# Patient Record
Sex: Female | Born: 1995 | Race: White | Hispanic: No | Marital: Single | Smoking: Never smoker
Health system: Southern US, Community
[De-identification: ages and names within clinical notes are randomized; demographics above are authoritative.]

## PROBLEM LIST (undated history)

## (undated) DIAGNOSIS — Z9889 Other specified postprocedural states: Secondary | ICD-10-CM

## (undated) DIAGNOSIS — R112 Nausea with vomiting, unspecified: Secondary | ICD-10-CM

## (undated) DIAGNOSIS — B2790 Infectious mononucleosis, unspecified without complication: Secondary | ICD-10-CM

## (undated) HISTORY — PX: OTHER SURGICAL HISTORY: SHX169

---
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
## 2018-02-07 ENCOUNTER — Other Ambulatory Visit: Payer: Self-pay

## 2018-02-07 ENCOUNTER — Encounter (HOSPITAL_COMMUNITY): Payer: Self-pay

## 2018-02-07 ENCOUNTER — Encounter (HOSPITAL_COMMUNITY): Payer: Self-pay | Admitting: Family Medicine

## 2018-02-07 ENCOUNTER — Ambulatory Visit (HOSPITAL_COMMUNITY)
Admission: EM | Admit: 2018-02-07 | Discharge: 2018-02-07 | Disposition: A | Discharge status: Home / Self Care | Payer: BLUE CROSS/BLUE SHIELD | Source: Home / Self Care | Attending: Internal Medicine | Admitting: Internal Medicine

## 2018-02-07 ENCOUNTER — Observation Stay (HOSPITAL_COMMUNITY)
Admission: EM | Admit: 2018-02-07 | Discharge: 2018-02-08 | Disposition: A | Discharge status: Home / Self Care | Payer: BLUE CROSS/BLUE SHIELD | Attending: Internal Medicine | Admitting: Internal Medicine

## 2018-02-07 ENCOUNTER — Emergency Department (HOSPITAL_COMMUNITY): Payer: BLUE CROSS/BLUE SHIELD

## 2018-02-07 DIAGNOSIS — K1121 Acute sialoadenitis: Secondary | ICD-10-CM | POA: Diagnosis not present

## 2018-02-07 DIAGNOSIS — Z8619 Personal history of other infectious and parasitic diseases: Secondary | ICD-10-CM | POA: Insufficient documentation

## 2018-02-07 DIAGNOSIS — R220 Localized swelling, mass and lump, head: Secondary | ICD-10-CM

## 2018-02-07 DIAGNOSIS — K1120 Sialoadenitis, unspecified: Secondary | ICD-10-CM | POA: Diagnosis not present

## 2018-02-07 DIAGNOSIS — R6884 Jaw pain: Secondary | ICD-10-CM | POA: Insufficient documentation

## 2018-02-07 DIAGNOSIS — R590 Localized enlarged lymph nodes: Secondary | ICD-10-CM | POA: Diagnosis not present

## 2018-02-07 HISTORY — DX: Other specified postprocedural states: R11.2

## 2018-02-07 HISTORY — DX: Infectious mononucleosis, unspecified without complication: B27.90

## 2018-02-07 HISTORY — DX: Other specified postprocedural states: Z98.890

## 2018-02-07 LAB — CBC WITH DIFFERENTIAL/PLATELET
Basophils Absolute: 0 10*3/uL (ref 0.0–0.1)
Basophils Relative: 0 %
Eosinophils Absolute: 0 10*3/uL (ref 0.0–0.7)
Eosinophils Relative: 0 %
HEMATOCRIT: 39.9 % (ref 36.0–46.0)
HEMOGLOBIN: 13.3 g/dL (ref 12.0–15.0)
LYMPHS ABS: 1.5 10*3/uL (ref 0.7–4.0)
LYMPHS PCT: 14 %
MCH: 30.9 pg (ref 26.0–34.0)
MCHC: 33.3 g/dL (ref 30.0–36.0)
MCV: 92.8 fL (ref 78.0–100.0)
Monocytes Absolute: 0.9 10*3/uL (ref 0.1–1.0)
Monocytes Relative: 9 %
NEUTROS ABS: 7.9 10*3/uL — AB (ref 1.7–7.7)
NEUTROS PCT: 77 %
Platelets: 229 10*3/uL (ref 150–400)
RBC: 4.3 MIL/uL (ref 3.87–5.11)
RDW: 13 % (ref 11.5–15.5)
WBC: 10.3 10*3/uL (ref 4.0–10.5)

## 2018-02-07 LAB — BASIC METABOLIC PANEL
Anion gap: 10 (ref 5–15)
BUN: 9 mg/dL (ref 6–20)
CHLORIDE: 103 mmol/L (ref 98–111)
CO2: 24 mmol/L (ref 22–32)
CREATININE: 0.67 mg/dL (ref 0.44–1.00)
Calcium: 9.1 mg/dL (ref 8.9–10.3)
GFR calc Af Amer: >60 mL/min (ref >60)
GFR calc non Af Amer: >60 mL/min (ref >60)
Glucose, Bld: 85 mg/dL (ref 70–99)
POTASSIUM: 3.7 mmol/L (ref 3.5–5.1)
SODIUM: 137 mmol/L (ref 135–145)

## 2018-02-07 LAB — I-STAT BETA HCG BLOOD, ED (MC, WL, AP ONLY)

## 2018-02-07 MED ORDER — DEXAMETHASONE SODIUM PHOSPHATE 10 MG/ML IJ SOLN
10.0000 mg | Freq: Once | INTRAMUSCULAR | Status: AC
  Administered 2018-02-07: 10 mg via INTRAVENOUS
  Filled 2018-02-07: qty 1

## 2018-02-07 MED ORDER — ACETAMINOPHEN 325 MG PO TABS: 650.0000 mg | ORAL_TABLET | Freq: Four times a day (QID) | ORAL | Status: DC | PRN

## 2018-02-07 MED ORDER — SODIUM CHLORIDE 0.9% FLUSH
3.0000 mL | Freq: Two times a day (BID) | INTRAVENOUS | Status: DC
  Administered 2018-02-07: 3 mL via INTRAVENOUS

## 2018-02-07 MED ORDER — ONDANSETRON HCL 4 MG/2ML IJ SOLN: 4.0000 mg | Freq: Four times a day (QID) | INTRAMUSCULAR | Status: DC | PRN

## 2018-02-07 MED ORDER — SODIUM CHLORIDE 0.9 % IV SOLN
Freq: Once | INTRAVENOUS | Status: AC
  Administered 2018-02-08: 02:00:00 via INTRAVENOUS

## 2018-02-07 MED ORDER — OXYMETAZOLINE HCL 0.05 % NA SOLN
1.0000 | Freq: Once | NASAL | Status: DC | PRN
  Filled 2018-02-07: qty 15

## 2018-02-07 MED ORDER — ACETAMINOPHEN 650 MG RE SUPP: 650.0000 mg | Freq: Four times a day (QID) | RECTAL | Status: DC | PRN

## 2018-02-07 MED ORDER — CEFTRIAXONE SODIUM 1 G IJ SOLR
1.0000 g | Freq: Once | INTRAMUSCULAR | Status: AC
  Administered 2018-02-07: 1 g via INTRAMUSCULAR

## 2018-02-07 MED ORDER — ONDANSETRON HCL 4 MG/2ML IJ SOLN
4.0000 mg | Freq: Once | INTRAMUSCULAR | Status: AC
  Administered 2018-02-07: 4 mg via INTRAVENOUS
  Filled 2018-02-07: qty 2

## 2018-02-07 MED ORDER — KETOROLAC TROMETHAMINE 30 MG/ML IJ SOLN
15.0000 mg | Freq: Once | INTRAMUSCULAR | Status: AC
  Administered 2018-02-07: 15 mg via INTRAVENOUS
  Filled 2018-02-07: qty 1

## 2018-02-07 MED ORDER — LIDOCAINE HCL (PF) 1 % IJ SOLN
INTRAMUSCULAR | Status: AC
  Filled 2018-02-07: qty 2

## 2018-02-07 MED ORDER — SILVER NITRATE-POT NITRATE 75-25 % EX MISC
1.0000 | Freq: Once | CUTANEOUS | Status: DC | PRN
  Filled 2018-02-07: qty 1

## 2018-02-07 MED ORDER — SODIUM CHLORIDE 0.9 % IV BOLUS
1000.0000 mL | Freq: Once | INTRAVENOUS | Status: AC
  Administered 2018-02-07: 1000 mL via INTRAVENOUS

## 2018-02-07 MED ORDER — SODIUM CHLORIDE 0.9 % IV SOLN: Freq: Once | INTRAVENOUS | Status: DC

## 2018-02-07 MED ORDER — HYDROCODONE-ACETAMINOPHEN 5-325 MG PO TABS: 1.0000 | ORAL_TABLET | ORAL | Status: DC | PRN

## 2018-02-07 MED ORDER — LIDOCAINE HCL 2 % EX GEL
1.0000 "application " | Freq: Once | CUTANEOUS | Status: DC | PRN
  Filled 2018-02-07: qty 5

## 2018-02-07 MED ORDER — IOHEXOL 300 MG/ML  SOLN
75.0000 mL | Freq: Once | INTRAMUSCULAR | Status: AC | PRN
  Administered 2018-02-07: 75 mL via INTRAVENOUS

## 2018-02-07 MED ORDER — BACITRACIN-NEOMYCIN-POLYMYXIN 400-5-5000 EX OINT: 1.0000 "application " | TOPICAL_OINTMENT | Freq: Once | CUTANEOUS | Status: DC | PRN

## 2018-02-07 MED ORDER — NORETHINDRONE ACET-ETHINYL EST 1-20 MG-MCG PO TABS
1.0000 | ORAL_TABLET | Freq: Every day | ORAL | Status: DC
  Administered 2018-02-07: 1 via ORAL

## 2018-02-07 MED ORDER — ENOXAPARIN SODIUM 40 MG/0.4ML ~~LOC~~ SOLN
40.0000 mg | Freq: Every day | SUBCUTANEOUS | Status: DC
  Administered 2018-02-07: 40 mg via SUBCUTANEOUS
  Filled 2018-02-07: qty 0.4

## 2018-02-07 MED ORDER — SODIUM CHLORIDE 0.9 % IV SOLN
3.0000 g | Freq: Four times a day (QID) | INTRAVENOUS | Status: DC
  Administered 2018-02-07 – 2018-02-08 (×4): 3 g via INTRAVENOUS
  Filled 2018-02-07 (×5): qty 3

## 2018-02-07 MED ORDER — AMOXICILLIN-POT CLAVULANATE 875-125 MG PO TABS: 1.0000 | ORAL_TABLET | Freq: Two times a day (BID) | ORAL | 0 refills | Status: AC

## 2018-02-07 MED ORDER — CEFTRIAXONE SODIUM 1 G IJ SOLR
INTRAMUSCULAR | Status: AC
  Filled 2018-02-07: qty 10

## 2018-02-07 MED ORDER — FLUOXETINE HCL 20 MG PO CAPS
40.0000 mg | ORAL_CAPSULE | Freq: Every day | ORAL | Status: DC
  Administered 2018-02-08: 40 mg via ORAL
  Filled 2018-02-07: qty 2

## 2018-02-07 MED ORDER — ONDANSETRON HCL 4 MG PO TABS: 4.0000 mg | ORAL_TABLET | Freq: Four times a day (QID) | ORAL | Status: DC | PRN

## 2018-02-07 MED ORDER — KETOROLAC TROMETHAMINE 15 MG/ML IJ SOLN: 15.0000 mg | Freq: Four times a day (QID) | INTRAMUSCULAR | Status: DC | PRN

## 2018-02-07 MED ORDER — LIDOCAINE HCL 4 % EX SOLN
0.0000 mL | Freq: Once | CUTANEOUS | Status: DC | PRN
  Filled 2018-02-07: qty 50

## 2018-02-07 MED ORDER — LIDOCAINE-EPINEPHRINE (PF) 2 %-1:200000 IJ SOLN
0.0000 mL | Freq: Once | INTRAMUSCULAR | Status: DC | PRN
  Filled 2018-02-07: qty 30

## 2018-02-07 MED ORDER — IBUPROFEN 400 MG PO TABS
400.0000 mg | ORAL_TABLET | ORAL | Status: DC | PRN
Start: 2018-02-07 — End: 2018-02-08
  Administered 2018-02-07: 400 mg via ORAL
  Filled 2018-02-07: qty 1

## 2018-02-07 NOTE — H&P (Signed)
History and Physical    Dana Corneanna Byron RUE:[id number] DOB: 08/17/1995 DOA: 02/07/2018  Referring MD/NP/PA: Harolyn RutherfordShawn Joy, PA-C PCP: System, Provider Not In  Patient coming from: home  Chief Complaint: Facial swelling  I have personally briefly reviewed patient's old medical records in [place] [facility]   HPI: Dana [name] is a 22 y.o. female with medical history significant of history of mononucleosis; who presents with complaints of right jaw swelling and pain which she first noted yesterday morning.  Initially swelling was minimal.  She describes the pain as an achy with radiation down to the upper part of her neck.  Associated symptoms include some mild discomfort with eating. Denies having any fever, chills, abdominal pain, vomiting, or inability to tolerate secretions.  Patient was seen in urgent care yesterday and given Aleve.  However she woke up this morning with worsening swelling and pain of her jaw.  She went back to urgent care today, and was given IM Rocephin and sent to the emergency room for CT scan of her neck.     ED Course: Upon admission to the emergency department patient was noted to be afebrile with vitals within normal limits.  Labs revealed WBC 10.3 with increased neutrophil count, but otherwise were within normal limits.  CT scan of the neck and soft tissues with contrast showed enlarged right parotid gland with surrounding inflammation suggestive of acute parotiditis.  Dr. Lazarus SalinesWolicki of ENT was consulted and recommended treated patient with Unasyn, 10 mg of Decadron, 1 L of normal saline IV fluids, and 15 mg of ketorolac IV.  Review of Systems  Constitutional: Negative for chills and fever.  HENT: Negative for ear pain and sore throat.        Positive for right jaw swelling and pain  Eyes: Negative for photophobia and pain.  Respiratory: Negative for cough and shortness of breath.   Cardiovascular: Negative for chest pain and leg swelling.  Gastrointestinal: Negative for  abdominal pain, nausea and vomiting.  Genitourinary: Negative for dysuria and frequency.  Musculoskeletal: Negative for falls.  Skin: Negative for itching and rash.  Neurological: Negative for focal weakness and loss of consciousness.  Psychiatric/Behavioral: Negative for substance abuse. The patient is not nervous/anxious.     Past Medical History:  Diagnosis Date  . Mononucleosis Fall 2017 to Winter 2018  . PONV (postoperative nausea and vomiting)    vomiiting with wisdom teeth removal    Past Surgical History:  Procedure Laterality Date  . dental implant and bone graft - right upper near the front - Fall 2015    . WISDOM TOOTH EXTRACTION Bilateral 2015     reports that she has never smoked. She has never used smokeless tobacco. She reports that she drinks alcohol. She reports that she does not use drugs.  No Known Allergies  Family History  Problem Relation Age of Onset  . Healthy Mother   . Healthy Father     Prior to Admission medications   Medication Sig Start Date End Date Taking? Authorizing Provider  FLUoxetine (PROZAC) 40 MG capsule Take 40 mg by mouth daily.   Yes [provider]  ibuprofen (ADVIL,MOTRIN) 200 MG tablet Take 400 mg by mouth daily as needed (pain).   Yes [provider]  naproxen sodium (ALEVE) 220 MG tablet Take 440 mg by mouth daily as needed (pain).   Yes [provider]  norethindrone-ethinyl estradiol (MICROGESTIN,JUNEL,LOESTRIN) 1-20 MG-MCG tablet Take 1 tablet by mouth daily.   Yes [provider]  amoxicillin-clavulanate (  AUGMENTIN) 875-125 MG tablet Take 1 tablet by mouth every 12 (twelve) hours. 02/07/18   Janace Aris, NP    Physical Exam:  Constitutional: Young female NAD, calm, comfortable Vitals:   02/07/18 1639 02/07/18 1640  BP:  (!) 128/93  Pulse:  95  Resp:  14  Temp:  98.4 F (36.9 C)  TempSrc:  Oral  SpO2:  100%  Weight: 65.8 kg (145 lb)   Height: 5\' 3"  (1.6 m)    Eyes: PERRL, lids  and conjunctivae normal ENMT: Swelling noted to the right parotid and of the jaw with trismus present Neck: Right submandibular lymphadenopathy noted with tenderness to palpation of the neck and jaw area.  No stridor noted. Respiratory: clear to auscultation bilaterally, no wheezing, no crackles. Normal respiratory effort. No accessory muscle use.  Cardiovascular: Regular rate and rhythm, no murmurs / rubs / gallops. No extremity edema. 2+ pedal pulses. No carotid bruits.  Abdomen: no tenderness, no masses palpated. No hepatosplenomegaly. Bowel sounds positive.  Musculoskeletal: no clubbing / cyanosis. No joint deformity upper and lower extremities. Good ROM, no contractures. Normal muscle tone.  Skin: no rashes, lesions, ulcers. No induration Neurologic: CN 2-12 grossly intact. Sensation intact, DTR normal. Strength 5/5 in all 4.  Psychiatric: Normal judgment and insight. Alert and oriented x 3. Normal mood.     Labs on Admission: I have personally reviewed following labs and imaging studies  CBC: Recent Labs  Lab 02/07/18 1751  WBC 10.3  NEUTROABS 7.9*  HGB 13.3  HCT 39.9  MCV 92.8  PLT 229   Basic Metabolic Panel: Recent Labs  Lab 02/07/18 1751  NA 137  K 3.7  CL 103  CO2 24  GLUCOSE 85  BUN 9  CREATININE 0.67  CALCIUM 9.1   GFR: Estimated Creatinine Clearance: 101.5 mL/min (by C-G formula based on SCr of 0.67 mg/dL). Liver Function Tests: No results for input(s): AST, ALT, ALKPHOS, BILITOT, PROT, ALBUMIN in the last 168 hours. No results for input(s): LIPASE, AMYLASE in the last 168 hours. No results for input(s): AMMONIA in the last 168 hours. Coagulation Profile: No results for input(s): INR, PROTIME in the last 168 hours. Cardiac Enzymes: No results for input(s): CKTOTAL, CKMB, CKMBINDEX, TROPONINI in the last 168 hours. BNP (last 3 results) No results for input(s): PROBNP in the last 8760 hours. HbA1C: No results for input(s): HGBA1C in the last 72  hours. CBG: No results for input(s): GLUCAP in the last 168 hours. Lipid Profile: No results for input(s): CHOL, HDL, LDLCALC, TRIG, CHOLHDL, LDLDIRECT in the last 72 hours. Thyroid Function Tests: No results for input(s): TSH, T4TOTAL, FREET4, T3FREE, THYROIDAB in the last 72 hours. Anemia Panel: No results for input(s): VITAMINB12, FOLATE, FERRITIN, TIBC, IRON, RETICCTPCT in the last 72 hours. Urine analysis: No results found for: COLORURINE, APPEARANCEUR, LABSPEC, PHURINE, GLUCOSEU, HGBUR, BILIRUBINUR, KETONESUR, PROTEINUR, UROBILINOGEN, NITRITE, LEUKOCYTESUR Sepsis Labs: No results found for this or any previous visit (from the past 240 hour(s)).   Radiological Exams on Admission: Ct Soft Tissue Neck W Contrast  Result Date: 02/07/2018 CLINICAL DATA:  Right facial swelling. Swelling and pain have rapidly worsened. EXAM: CT NECK WITH CONTRAST TECHNIQUE: Multidetector CT imaging of the neck was performed using the standard protocol following the bolus administration of intravenous contrast. CONTRAST:  75mL OMNIPAQUE IOHEXOL 300 MG/ML  SOLN COMPARISON:  None. FINDINGS: PHARYNX AND LARYNX: --Nasopharynx: Fossae of Rosenmuller are clear. Normal adenoid tonsils for age. --Oral cavity and oropharynx: The palatine and lingual tonsils are  normal. The visible oral cavity and floor of mouth are normal. --Hypopharynx: Normal vallecula and pyriform sinuses. --Larynx: Normal epiglottis and pre-epiglottic space. Normal aryepiglottic and vocal folds. --Retropharyngeal space: No abscess, effusion or lymphadenopathy. SALIVARY GLANDS: --Parotid: The right parotid gland is enlarged with surrounding inflammatory stranding that extends along the lateral right neck to the level of the thyroid cartilage. There is thickening of the overlying platysma muscle with mild inflammatory induration of the adjacent subcutaneous fat. There is no abscess or fluid collection. No sialolithiasis. The left parotid gland is normal.  --Submandibular: Symmetric without inflammation. No sialolithiasis or ductal dilatation. --Sublingual: Normal. No ranula or other visible lesion of the base of tongue and floor of mouth. THYROID: Normal. LYMPH NODES: There are multiple right level 2A reactive lymph nodes measuring up to 9 mm. VASCULAR: Major cervical vessels are patent. LIMITED INTRACRANIAL: Normal. VISUALIZED ORBITS: Normal. MASTOIDS AND VISUALIZED PARANASAL SINUSES: No fluid levels or advanced mucosal thickening. No mastoid effusion. SKELETON: No bony spinal canal stenosis. No lytic or blastic lesions. UPPER CHEST: Clear. OTHER: None. IMPRESSION: 1. Enlarged right parotid gland with surrounding inflammatory change extending inferiorly along the right neck to approximately the level of the thyroid cartilage. This is most suggestive of acute parotiditis. No sialolithiasis or ductal obstruction. 2. Reactive right cervical lymphadenopathy. Electronically Signed   By: Deatra Robinson M.D.   On: 02/07/2018 19:26      Assessment/Plan Parotiditis: Acute.  Patient presents with swelling of the jaw since yesterday morning.  CT imaging reveals signs of acute parotiditis without signs of obstruction.  Patient treated with Unasyn.  ENT to see in a.m. - Admit to a MedSurg bed - Continue Unasyn IV - Toradol IV as needed for pain  - Appreciate ENT consultative services Dr. Ellie Lunch to see in a.m - Continue recommendations for patient to eat something sour every 1 hour  DVT prophylaxis: Lovenox Code Status: Full Family Communication: Discussed plan of care with the patient family present at bedside Disposition Plan: Likely discharge home in 1 to 2 days Consults called: ENT Admission status:observation   Clydie Braun MD [facility] Pager [phone]   If 7PM-7AM, please contact night-coverage [email or web address] Password [facility]  02/07/2018, 8:09 PM

## 2018-02-07 NOTE — Consult Note (Signed)
Dana [name],  Dana [name] 22 y.o., female [id number]     Chief Complaint:  RIGHT cheek swelling  HPI: 22 yo wf, sl upper neck pain 2 days ago.  Onset small mass at angle of RIGHT mandible yesterday.   Today with full swelling of RIGHT parotid area.  Some trismus.  No additional swelling or pain with meals.  No fever.  No trauma to her neck or face.  No radiocontrast dye.  No prior similar events.  No complaints of dry eyes or dry mouth.  Possible pericoronitis on RIGHT side for which she is overdue to see the dentist.  No immune compromise.  Has presumably had all appropriate vaccinations including mumps.    PMH: Past Medical History:  Diagnosis Date  . Mononucleosis Fall 2017 to Winter 2018  . PONV (postoperative nausea and vomiting)    vomiiting with wisdom teeth removal    Surg Hx: Past Surgical History:  Procedure Laterality Date  . dental implant and bone graft - right upper near the front - Fall 2015    . WISDOM TOOTH EXTRACTION Bilateral 2015    FHx:   Family History  Problem Relation Age of Onset  . Healthy Mother   . Healthy Father    SocHx:  reports that she has never smoked. She has never used smokeless tobacco. She reports that she drinks alcohol. She reports that she does not use drugs.  ALLERGIES: No Known Allergies  Medications Prior to Admission  Medication Sig Dispense Refill  . FLUoxetine (PROZAC) 40 MG capsule Take 40 mg by mouth daily.    Marland Kitchen ibuprofen (ADVIL,MOTRIN) 200 MG tablet Take 400 mg by mouth daily as needed (pain).    . naproxen sodium (ALEVE) 220 MG tablet Take 440 mg by mouth daily as needed (pain).    . norethindrone-ethinyl estradiol (MICROGESTIN,JUNEL,LOESTRIN) 1-20 MG-MCG tablet Take 1 tablet by mouth daily.    Marland Kitchen amoxicillin-clavulanate (AUGMENTIN) 875-125 MG tablet Take 1 tablet by mouth every 12 (twelve) hours. 14 tablet 0    Results for orders placed or performed during the hospital encounter of 02/07/18 (from the past 48 hour(s))  CBC with  Differential     Status: Abnormal   Collection Time: 02/07/18  5:51 PM  Result Value Ref Range   WBC 10.3 4.0 - 10.5 K/uL   RBC 4.30 3.87 - 5.11 MIL/uL   Hemoglobin 13.3 12.0 - 15.0 g/dL   HCT 39.9 36.0 - 46.0 %   MCV 92.8 78.0 - 100.0 fL   MCH 30.9 26.0 - 34.0 pg   MCHC 33.3 30.0 - 36.0 g/dL   RDW 13.0 11.5 - 15.5 %   Platelets 229 150 - 400 K/uL   Neutrophils Relative % 77 %   Neutro Abs 7.9 (H) 1.7 - 7.7 K/uL   Lymphocytes Relative 14 %   Lymphs Abs 1.5 0.7 - 4.0 K/uL   Monocytes Relative 9 %   Monocytes Absolute 0.9 0.1 - 1.0 K/uL   Eosinophils Relative 0 %   Eosinophils Absolute 0.0 0.0 - 0.7 K/uL   Basophils Relative 0 %   Basophils Absolute 0.0 0.0 - 0.1 K/uL    Comment: Performed at [facility], [place] [address], [place], [place] [address]  Basic metabolic panel     Status: None   Collection Time: 02/07/18  5:51 PM  Result Value Ref Range   Sodium 137 135 - 145 mmol/L   Potassium 3.7 3.5 - 5.1 mmol/L   Chloride 103 98 - 111 mmol/L  CO2 24 22 - 32 mmol/L   Glucose, Bld 85 70 - 99 mg/dL   BUN 9 6 - 20 mg/dL   Creatinine, Ser 0.67 0.44 - 1.00 mg/dL   Calcium 9.1 8.9 - 10.3 mg/dL   GFR calc non Af Amer >60 >60 mL/min   GFR calc Af Amer >60 >60 mL/min    Comment: (NOTE) The eGFR has been calculated using the CKD EPI equation. This calculation has not been validated in all clinical situations. eGFR's persistently <60 mL/min signify possible Chronic Kidney Disease.    Anion gap 10 5 - 15    Comment: Performed at [facility], [place] [address], [place], [place] [address]  I-Stat beta hCG blood, ED     Status: None   Collection Time: 02/07/18  6:03 PM  Result Value Ref Range   I-stat hCG, quantitative <5.0 <5 mIU/mL   Comment 3            Comment:   GEST. AGE      CONC.  (mIU/mL)   <=1 WEEK        5 - 50     2 WEEKS       50 - 500     3 WEEKS       100 - 10,000     4 WEEKS     1,000 - 30,000        FEMALE AND NON-PREGNANT  FEMALE:     LESS THAN 5 mIU/mL    Ct Soft Tissue Neck W Contrast  Result Date: 02/07/2018 CLINICAL DATA:  Right facial swelling. Swelling and pain have rapidly worsened. EXAM: CT NECK WITH CONTRAST TECHNIQUE: Multidetector CT imaging of the neck was performed using the standard protocol following the bolus administration of intravenous contrast. CONTRAST:  36m OMNIPAQUE IOHEXOL 300 MG/ML  SOLN COMPARISON:  None. FINDINGS: PHARYNX AND LARYNX: --Nasopharynx: Fossae of Rosenmuller are clear. Normal adenoid tonsils for age. --Oral cavity and oropharynx: The palatine and lingual tonsils are normal. The visible oral cavity and floor of mouth are normal. --Hypopharynx: Normal vallecula and pyriform sinuses. --Larynx: Normal epiglottis and pre-epiglottic space. Normal aryepiglottic and vocal folds. --Retropharyngeal space: No abscess, effusion or lymphadenopathy. SALIVARY GLANDS: --Parotid: The right parotid gland is enlarged with surrounding inflammatory stranding that extends along the lateral right neck to the level of the thyroid cartilage. There is thickening of the overlying platysma muscle with mild inflammatory induration of the adjacent subcutaneous fat. There is no abscess or fluid collection. No sialolithiasis. The left parotid gland is normal. --Submandibular: Symmetric without inflammation. No sialolithiasis or ductal dilatation. --Sublingual: Normal. No ranula or other visible lesion of the base of tongue and floor of mouth. THYROID: Normal. LYMPH NODES: There are multiple right level 2A reactive lymph nodes measuring up to 9 mm. VASCULAR: Major cervical vessels are patent. LIMITED INTRACRANIAL: Normal. VISUALIZED ORBITS: Normal. MASTOIDS AND VISUALIZED PARANASAL SINUSES: No fluid levels or advanced mucosal thickening. No mastoid effusion. SKELETON: No bony spinal canal stenosis. No lytic or blastic lesions. UPPER CHEST: Clear. OTHER: None. IMPRESSION: 1. Enlarged right parotid gland with surrounding  inflammatory change extending inferiorly along the right neck to approximately the level of the thyroid cartilage. This is most suggestive of acute parotiditis. No sialolithiasis or ductal obstruction. 2. Reactive right cervical lymphadenopathy. Electronically Signed   By: KUlyses JarredM.D.   On: 02/07/2018 19:26      Blood pressure 125/81, pulse (!) 103, temperature 98.2 F (36.8 C), temperature source Oral,  resp. rate 16, height 5' 3"  (1.6 m), weight 65.8 kg (145 lb), SpO2 100 %.  PHYSICAL EXAM: Overall appearance:  Animated.   Mental status sharp.   Head: NCAT Ears: not examined Nose:  clear Oral Cavity: moist with teeth in good repair.  No saliva expressed from RIGHT Stensen's duct.   Oral Pharynx/Hypopharynx/Larynx:  nl Neuro:  intact Neck:  Tender firm swelling of RIGHT parotid gland.  No overlying skin erythema or skin changes.  No neck nodes//  Studies Reviewed:  CT neck    Assessment/Plan   Acute parotitis, RIGHT.  No evidence of chemical parotitis.  Doubt Sjogren's syndrome.  Probably viral, but not mumps.  Could be bacteria.  I would emphasize hydration, elevation, warm compresses, "milking" the gland, sialogogues and antibiotic coverage.  I suspect she will have fairly rapid symptomatic resolution and at that point she can go home.  Recheck my office 2 weeks.    Jodi Marble 02/08/5052, 10:59 PM

## 2018-02-07 NOTE — Progress Notes (Signed)
Received telephone report for admission from ED.

## 2018-02-07 NOTE — ED Provider Notes (Signed)
[facility]    CSN: [id number] Arrival date & time: 02/07/18  1415     History   Chief Complaint Chief Complaint  Patient presents with  . Appointment    1441  . Facial Swelling    HPI Dana Dana [name] is a 22 y.o. female.   She is a healthy 22 year old female presents with right facial swelling,  extending into right neck area and behind the right ear.  The swelling and pain has gotten rapidly worse since yesterday.  She was seen at [facility] walk-in clinic yesterday and told to take naproxen twice a day for inflammatory process.  No relief from this only worsening symptoms.  She denies any associated fever chills.  She is very limited on how far she can open her mouth and is very tender to touch with mild erythema.  She denies any dental problems or dental pain.  This is very worrisome considering how fast this has progressed and less than 24 hours.  She denies any trouble breathing, trouble swallowing or drooling.   ROS per HPI      History reviewed. No pertinent past medical history.  There are no active problems to display for this patient.   History reviewed. No pertinent surgical history.  OB History   None      Home Medications    Prior to Admission medications   Medication Sig Start Date End Date Taking? Authorizing Provider  FLUoxetine (PROZAC) 40 MG capsule Take 40 mg by mouth daily.   Yes [provider]  norethindrone-ethinyl estradiol (MICROGESTIN,JUNEL,LOESTRIN) 1-20 MG-MCG tablet Take 1 tablet by mouth daily.   Yes [provider]  amoxicillin-clavulanate (AUGMENTIN) 875-125 MG tablet Take 1 tablet by mouth every 12 (twelve) hours. 02/07/18   Janace Aris, NP    Family History Family History  Problem Relation Age of Onset  . Healthy Mother   . Healthy Father     Social History Social History   Tobacco Use  . Smoking status: Never Smoker  . Smokeless tobacco: Never Used  Substance Use Topics  . Alcohol use: Yes   Comment: occ  . Drug use: Never     Allergies   Patient has no known allergies.   Review of Systems Review of Systems   Physical Exam Triage Vital Signs ED Triage Vitals [02/07/18 1449]  Enc Vitals Group     BP 118/83     Pulse Rate 98     Resp 18     Temp 98.5 F (36.9 C)     Temp src      SpO2 97 %     Weight      Height      Dana [name] Circumference      Peak Flow      Pain Score 4     Pain Loc      Pain Edu?      Excl. in GC?    No data found.  Updated Vital Signs BP 118/83   Pulse 98   Temp 98.5 F (36.9 C)   Resp 18   SpO2 97%   Visual Acuity Right Eye Distance:   Left Eye Distance:   Bilateral Distance:    Right Eye Near:   Left Eye Near:    Bilateral Near:     Physical Exam  Constitutional: She is oriented to person, place, and time. She appears well-developed and well-nourished.  HENT:  Dana [name]: Normocephalic and atraumatic.  See picture for detail.  Significant swelling  to right jaw/TMJ extending into right lateral neck and behind the right ear.  Mild erythema.  Very tender to touch in right lateral neck area.  Trismus noted. Floor of mouth soft to palpation.  Neck soft but very tender to palpation.  No dental caries or broken teeth noted in the mouth.  No oropharyngeal erythema.  No tonsillar swelling or exudates.  Uvula is midline.    Neck:  Limited due to the swelling and pain.   Cardiovascular: Normal rate and regular rhythm.  Pulmonary/Chest: Effort normal and breath sounds normal.  Neurological: She is alert and oriented to person, place, and time.  Skin: Skin is warm and dry. Capillary refill takes less than 2 seconds.  Psychiatric: She has a normal mood and affect.  Nursing note and vitals reviewed.        UC Treatments / Results  Labs (all labs ordered are listed, but only abnormal results are displayed) Labs Reviewed - No data to display  EKG None  Radiology No results found.  Procedures Procedures (including  critical care time)  Medications Ordered in UC Medications  cefTRIAXone (ROCEPHIN) injection 1 g (1 g Intramuscular Given 02/07/18 1555)    Initial Impression / Assessment and Plan / UC Course  I have reviewed the triage vital signs and the nursing notes.  Pertinent labs & imaging results that were available during my care of the patient were reviewed by me and considered in my medical decision making (see chart for details).     Concerned  about patient's rapidly progressing swelling in jaw and neck area. Pt may need a CT scan to R/O abscess. IM rocephin given here in the clinic. Sent to the ER for further evaluation.   Final Clinical Impressions(s) / UC Diagnoses   Final diagnoses:  Facial swelling     Discharge Instructions     It was nice meeting you!!  We gave you an injection of antibiotics here.  Please go to the ER for further evaluation.        ED Prescriptions    Medication Sig Dispense Auth. Provider   amoxicillin-clavulanate (AUGMENTIN) 875-125 MG tablet Take 1 tablet by mouth every 12 (twelve) hours. 14 tablet Dahlia ByesBast, Edna Grover A, NP     Controlled Substance Prescriptions [place] Controlled Substance Registry consulted? Not Applicable   Janace ArisBast, Kori Goins A, NP 02/07/18 1616

## 2018-02-07 NOTE — ED Notes (Signed)
ED TO INPATIENT HANDOFF REPORT  Name/Age/Gender Dana [name] 22 y.o. female  Code Status   Home/SNF/Other Home  Chief Complaint possible infection urgent care sent pt for ct scan  Level of Care/Admitting Diagnosis ED Disposition    ED Disposition Condition Comment   Admit  The patient appears reasonably stabilized for admission considering the current resources, flow, and capabilities available in the ED at this time, and I doubt any other [facility] requiring further screening and/or treatment in the ED prior to admission is  present.       Medical History Past Medical History:  Diagnosis Date  . Mononucleosis Fall 2017 to Winter 2018  . PONV (postoperative nausea and vomiting)    vomiiting with wisdom teeth removal    Allergies No Known Allergies  IV Location/Drains/Wounds Patient Lines/Drains/Airways Status   Active Line/Drains/Airways    None          Labs/Imaging Results for orders placed or performed during the hospital encounter of 02/07/18 (from the past 48 hour(s))  CBC with Differential     Status: Abnormal   Collection Time: 02/07/18  5:51 PM  Result Value Ref Range   WBC 10.3 4.0 - 10.5 K/uL   RBC 4.30 3.87 - 5.11 MIL/uL   Hemoglobin 13.3 12.0 - 15.0 g/dL   HCT 39.9 36.0 - 46.0 %   MCV 92.8 78.0 - 100.0 fL   MCH 30.9 26.0 - 34.0 pg   MCHC 33.3 30.0 - 36.0 g/dL   RDW 13.0 11.5 - 15.5 %   Platelets 229 150 - 400 K/uL   Neutrophils Relative % 77 %   Neutro Abs 7.9 (H) 1.7 - 7.7 K/uL   Lymphocytes Relative 14 %   Lymphs Abs 1.5 0.7 - 4.0 K/uL   Monocytes Relative 9 %   Monocytes Absolute 0.9 0.1 - 1.0 K/uL   Eosinophils Relative 0 %   Eosinophils Absolute 0.0 0.0 - 0.7 K/uL   Basophils Relative 0 %   Basophils Absolute 0.0 0.0 - 0.1 K/uL    Comment: Performed at [facility], [place] [address], [place], [place] [address]  Basic metabolic panel     Status: None   Collection Time: 02/07/18  5:51 PM  Result Value Ref Range    Sodium 137 135 - 145 mmol/L   Potassium 3.7 3.5 - 5.1 mmol/L   Chloride 103 98 - 111 mmol/L   CO2 24 22 - 32 mmol/L   Glucose, Bld 85 70 - 99 mg/dL   BUN 9 6 - 20 mg/dL   Creatinine, Ser 0.67 0.44 - 1.00 mg/dL   Calcium 9.1 8.9 - 10.3 mg/dL   GFR calc non Af Amer >60 >60 mL/min   GFR calc Af Amer >60 >60 mL/min    Comment: (NOTE) The eGFR has been calculated using the CKD EPI equation. This calculation has not been validated in all clinical situations. eGFR's persistently <60 mL/min signify possible Chronic Kidney Disease.    Anion gap 10 5 - 15    Comment: Performed at [facility], [place] [address] [place],  [address]  I-Stat beta hCG blood, ED     Status: None   Collection Time: 02/07/18  6:03 PM  Result Value Ref Range   I-stat hCG, quantitative <5.0 <5 mIU/mL   Comment 3            Comment:   GEST. AGE      CONC.  (mIU/mL)   <=1 WEEK  5 - 50     2 WEEKS       50 - 500     3 WEEKS       100 - 10,000     4 WEEKS     1,000 - 30,000        FEMALE AND NON-PREGNANT FEMALE:     LESS THAN 5 mIU/mL    Ct Soft Tissue Neck W Contrast  Result Date: 02/07/2018 CLINICAL DATA:  Right facial swelling. Swelling and pain have rapidly worsened. EXAM: CT NECK WITH CONTRAST TECHNIQUE: Multidetector CT imaging of the neck was performed using the standard protocol following the bolus administration of intravenous contrast. CONTRAST:  9m OMNIPAQUE IOHEXOL 300 MG/ML  SOLN COMPARISON:  None. FINDINGS: PHARYNX AND LARYNX: --Nasopharynx: Fossae of Rosenmuller are clear. Normal adenoid tonsils for age. --Oral cavity and oropharynx: The palatine and lingual tonsils are normal. The visible oral cavity and floor of mouth are normal. --Hypopharynx: Normal vallecula and pyriform sinuses. --Larynx: Normal epiglottis and pre-epiglottic space. Normal aryepiglottic and vocal folds. --Retropharyngeal space: No abscess, effusion or lymphadenopathy. SALIVARY GLANDS: --Parotid: The right  parotid gland is enlarged with surrounding inflammatory stranding that extends along the lateral right neck to the level of the thyroid cartilage. There is thickening of the overlying platysma muscle with mild inflammatory induration of the adjacent subcutaneous fat. There is no abscess or fluid collection. No sialolithiasis. The left parotid gland is normal. --Submandibular: Symmetric without inflammation. No sialolithiasis or ductal dilatation. --Sublingual: Normal. No ranula or other visible lesion of the base of tongue and floor of mouth. THYROID: Normal. LYMPH NODES: There are multiple right level 2A reactive lymph nodes measuring up to 9 mm. VASCULAR: Major cervical vessels are patent. LIMITED INTRACRANIAL: Normal. VISUALIZED ORBITS: Normal. MASTOIDS AND VISUALIZED PARANASAL SINUSES: No fluid levels or advanced mucosal thickening. No mastoid effusion. SKELETON: No bony spinal canal stenosis. No lytic or blastic lesions. UPPER CHEST: Clear. OTHER: None. IMPRESSION: 1. Enlarged right parotid gland with surrounding inflammatory change extending inferiorly along the right neck to approximately the level of the thyroid cartilage. This is most suggestive of acute parotiditis. No sialolithiasis or ductal obstruction. 2. Reactive right cervical lymphadenopathy. Electronically Signed   By: KUlyses JarredM.D.   On: 02/07/2018 19:26    Pending Labs Unresulted Labs (From admission, onward)   None      Vitals/Pain Today's Vitals   02/07/18 1638 02/07/18 1639 02/07/18 1640  BP:   (!) 128/93  Pulse:   95  Resp:   14  Temp:   98.4 F (36.9 C)  TempSrc:   Oral  SpO2:   100%  Weight:  145 lb (65.8 kg)   Height:  5' 3"  (1.6 m)   PainSc: 7       Isolation Precautions No active isolations  Medications Medications  Ampicillin-Sulbactam (UNASYN) 3 g in sodium chloride 0.9 % 100 mL IVPB (0 g Intravenous Stopped 02/07/18 2019)  0.9 %  sodium chloride infusion (has no administration in time range)   ketorolac (TORADOL) 30 MG/ML injection 15 mg (15 mg Intravenous Given 02/07/18 1805)  ondansetron (ZOFRAN) injection 4 mg (4 mg Intravenous Given 02/07/18 1805)  sodium chloride 0.9 % bolus 1,000 mL ( Intravenous Stopped 02/07/18 1855)  iohexol (OMNIPAQUE) 300 MG/ML solution 75 mL (75 mLs Intravenous Contrast Given 02/07/18 1857)  dexamethasone (DECADRON) injection 10 mg (10 mg Intravenous Given 02/07/18 1946)    Mobility walks

## 2018-02-07 NOTE — ED Provider Notes (Signed)
[facility]-EMERGENCY DEPT Provider Note   CSN: [id number] Arrival date & time: 02/07/18  1619     History   Chief Complaint Chief Complaint  Patient presents with  . Oral Swelling    HPI Dana [name] is a 22 y.o. female.  HPI   Dana [name] is a 22 y.o. female, patient with no pertinent past medical history, presenting to the ED with right-sided facial swelling noted yesterday morning.  States the swelling began in an area about the size of a nickel at her posterior right mandible.  She woke up this morning with significantly increased swelling and increased pain.  Pain is aching and sore, 7/10, radiating inferiorly under the jaw.  Accompanied by some nausea.  She was seen by a walk-in clinic yesterday and instructed on a regimen of anti-inflammatory medications. After the swelling worsened, patient went to urgent care today, was given IM Rocephin, and sent to the ED for CT scan.   Denies dental pain, fever/chills, nausea/vomiting, difficulty breathing or swallowing, or any other complaints.  Past Medical History:  Diagnosis Date  . Mononucleosis Fall 2017 to Winter 2018  . PONV (postoperative nausea and vomiting)    vomiiting with wisdom teeth removal    Patient Active Problem List   Diagnosis Date Noted  . Parotiditis 02/07/2018    Past Surgical History:  Procedure Laterality Date  . dental implant and bone graft - right upper near the front - Fall 2015    . WISDOM TOOTH EXTRACTION Bilateral 2015     OB History   None      Home Medications    Prior to Admission medications   Medication Sig Start Date End Date Taking? Authorizing Provider  FLUoxetine (PROZAC) 40 MG capsule Take 40 mg by mouth daily.   Yes [provider]  ibuprofen (ADVIL,MOTRIN) 200 MG tablet Take 400 mg by mouth daily as needed (pain).   Yes [provider]  naproxen sodium (ALEVE) 220 MG tablet Take 440 mg by mouth daily as needed (pain).   Yes  [provider]  norethindrone-ethinyl estradiol (MICROGESTIN,JUNEL,LOESTRIN) 1-20 MG-MCG tablet Take 1 tablet by mouth daily.   Yes [provider]  amoxicillin-clavulanate (AUGMENTIN) 875-125 MG tablet Take 1 tablet by mouth every 12 (twelve) hours. 02/07/18   Dana Aris, NP    Family History Family History  Problem Relation Age of Onset  . Healthy Mother   . Healthy Father     Social History Social History   Tobacco Use  . Smoking status: Never Smoker  . Smokeless tobacco: Never Used  Substance Use Topics  . Alcohol use: Yes    Comment: occ  . Drug use: Never     Allergies   Patient has no known allergies.   Review of Systems Review of Systems  Constitutional: Negative for chills, diaphoresis and fever.  HENT: Positive for facial swelling. Negative for dental problem, drooling, sore throat, trouble swallowing and voice change.   Respiratory: Negative for shortness of breath.   Cardiovascular: Negative for chest pain.  Gastrointestinal: Negative for abdominal pain, nausea and vomiting.  All other systems reviewed and are negative.    Physical Exam Updated Vital Signs BP (!) 128/93   Pulse 95   Temp 98.4 F (36.9 C) (Oral)   Resp 14   Ht 5\' 3"  (1.6 m)   Wt 65.8 kg (145 lb)   SpO2 100%   BMI 25.69 kg/m   Physical Exam  Constitutional: She appears well-developed  and well-nourished. No distress.  HENT:  Head: Normocephalic and atraumatic.  Mouth/Throat: There is trismus in the jaw.  Swelling, tenderness, and erythema to the right side of the face in the region of the right mandible.  She does have some submandibular swelling and tenderness and minor tenderness to the right side of the neck over the SCM muscle.  She notes some difficulty with fully opening her mouth.  Mouth opening is to about 1 finger width.  Appears to handle oral secretions without difficulty.  No noted tenderness or other abnormalities to the inside of the mouth.  Eyes:  Conjunctivae are normal.  Neck: Normal range of motion. Neck supple.  Cardiovascular: Normal rate, regular rhythm, normal heart sounds and intact distal pulses.  Pulmonary/Chest: Effort normal and breath sounds normal. No respiratory distress.  Abdominal: Soft. There is no tenderness. There is no guarding.  Musculoskeletal: She exhibits no edema.  Lymphadenopathy:    She has no cervical adenopathy.  Neurological: She is alert.  Skin: Skin is warm and dry. She is not diaphoretic.  Psychiatric: She has a normal mood and affect. Her behavior is normal.  Nursing note and vitals reviewed.    ED Treatments / Results  Labs (all labs ordered are listed, but only abnormal results are displayed) Labs Reviewed  CBC WITH DIFFERENTIAL/PLATELET - Abnormal; Notable for the following components:      Result Value   Neutro Abs 7.9 (*)    All other components within normal limits  BASIC METABOLIC PANEL  I-STAT BETA HCG BLOOD, ED ([facility], [facility], [facility] ONLY)    EKG None  Radiology Ct Soft Tissue Neck W Contrast  Result Date: 02/07/2018 CLINICAL DATA:  Right facial swelling. Swelling and pain have rapidly worsened. EXAM: CT NECK WITH CONTRAST TECHNIQUE: Multidetector CT imaging of the neck was performed using the standard protocol following the bolus administration of intravenous contrast. CONTRAST:  75mL OMNIPAQUE IOHEXOL 300 MG/ML  SOLN COMPARISON:  None. FINDINGS: PHARYNX AND LARYNX: --Nasopharynx: Fossae of Rosenmuller are clear. Normal adenoid tonsils for age. --Oral cavity and oropharynx: The palatine and lingual tonsils are normal. The visible oral cavity and floor of mouth are normal. --Hypopharynx: Normal vallecula and pyriform sinuses. --Larynx: Normal epiglottis and pre-epiglottic space. Normal aryepiglottic and vocal folds. --Retropharyngeal space: No abscess, effusion or lymphadenopathy. SALIVARY GLANDS: --Parotid: The right parotid gland is enlarged with surrounding inflammatory stranding that extends  along the lateral right neck to the level of the thyroid cartilage. There is thickening of the overlying platysma muscle with mild inflammatory induration of the adjacent subcutaneous fat. There is no abscess or fluid collection. No sialolithiasis. The left parotid gland is normal. --Submandibular: Symmetric without inflammation. No sialolithiasis or ductal dilatation. --Sublingual: Normal. No ranula or other visible lesion of the base of tongue and floor of mouth. THYROID: Normal. LYMPH NODES: There are multiple right level 2A reactive lymph nodes measuring up to 9 mm. VASCULAR: Major cervical vessels are patent. LIMITED INTRACRANIAL: Normal. VISUALIZED ORBITS: Normal. MASTOIDS AND VISUALIZED PARANASAL SINUSES: No fluid levels or advanced mucosal thickening. No mastoid effusion. SKELETON: No bony spinal canal stenosis. No lytic or blastic lesions. UPPER CHEST: Clear. OTHER: None. IMPRESSION: 1. Enlarged right parotid gland with surrounding inflammatory change extending inferiorly along the right neck to approximately the level of the thyroid cartilage. This is most suggestive of acute parotiditis. No sialolithiasis or ductal obstruction. 2. Reactive right cervical lymphadenopathy. Electronically Signed   By: Deatra RobinsonKevin  Herman M.D.   On: 02/07/2018 19:26  Procedures Procedures (including critical care time)  Medications Ordered in ED Medications  Ampicillin-Sulbactam (UNASYN) 3 g in sodium chloride 0.9 % 100 mL IVPB (0 g Intravenous Stopped 02/07/18 2019)  0.9 %  sodium chloride infusion (has no administration in time range)  ketorolac (TORADOL) 30 MG/ML injection 15 mg (15 mg Intravenous Given 02/07/18 1805)  ondansetron (ZOFRAN) injection 4 mg (4 mg Intravenous Given 02/07/18 1805)  sodium chloride 0.9 % bolus 1,000 mL ( Intravenous Stopped 02/07/18 1855)  iohexol (OMNIPAQUE) 300 MG/ML solution 75 mL (75 mLs Intravenous Contrast Given 02/07/18 1857)  dexamethasone (DECADRON) injection 10 mg (10 mg Intravenous  Given 02/07/18 1946)     Initial Impression / Assessment and Plan / ED Course  I have reviewed the triage vital signs and the nursing notes.  Pertinent labs & imaging results that were available during my care of the patient were reviewed by me and considered in my medical decision making (see chart for details).  Clinical Course as of Feb 08 2140  Fri Feb 07, 2018  1937 Spoke with Dr. Lazarus Salines, ENT.  Agrees with admission and Unasyn. Recommends patient drink/eat something sour about every hour while she is awake. He will come see the patient in the morning.   [SJ]  2024 Spoke with Dr. Katrinka Blazing, hospitalist. Agrees to admit the patient.   [SJ]    Clinical Course User Index [SJ] Simrah Chatham C, PA-C    Patient presents with right-sided facial swelling beginning yesterday. Patient is nontoxic appearing, afebrile, not tachycardic, not tachypneic, not hypotensive, maintains SPO2 of 100% on room air, and is in no apparent distress. However, patient does present with trismus and limited mouth opening.  CT confirms parotitis with inflammatory changes extending into the right side of the neck.  Patient admitted for IV antibiotics and fluids.    Findings and plan of care discussed with Linwood Dibbles, MD.   Vitals:   02/07/18 1639 02/07/18 1640 02/07/18 2125  BP:  (!) 128/93 127/84  Pulse:  95 89  Resp:  14 15  Temp:  98.4 F (36.9 C)   TempSrc:  Oral   SpO2:  100% 99%  Weight: 65.8 kg (145 lb)    Height: 5\' 3"  (1.6 m)       Final Clinical Impressions(s) / ED Diagnoses   Final diagnoses:  Parotitis, acute    ED Discharge Orders    None       Concepcion Living 02/07/18 2141    Linwood Dibbles, MD 02/07/18 [phone]

## 2018-02-07 NOTE — Discharge Instructions (Addendum)
It was nice meeting you!!  We gave you an injection of antibiotics here.  Please go to the ER for further evaluation.

## 2018-02-07 NOTE — ED Notes (Signed)
Bed: UC01 Expected date:  Expected time:  Means of arrival:  Comments: 

## 2018-02-07 NOTE — ED Notes (Signed)
Pt is drinking lemonade to drink something sour. Pt is also eating soup and pasta salad.

## 2018-02-07 NOTE — ED Triage Notes (Signed)
Pt was seen earlier at urgent care. Pt states that they were concerned for abscess and sent her over for CT

## 2018-02-07 NOTE — ED Triage Notes (Signed)
Pt presents with swelling to her right side of her face that is worsening.

## 2018-02-08 ENCOUNTER — Encounter (HOSPITAL_COMMUNITY): Payer: Self-pay | Admitting: Family Medicine

## 2018-02-08 DIAGNOSIS — K1120 Sialoadenitis, unspecified: Secondary | ICD-10-CM

## 2018-02-08 LAB — BASIC METABOLIC PANEL
Anion gap: 10 (ref 5–15)
BUN: 6 mg/dL (ref 6–20)
CO2: 21 mmol/L — AB (ref 22–32)
Calcium: 9.1 mg/dL (ref 8.9–10.3)
Chloride: 109 mmol/L (ref 98–111)
Creatinine, Ser: 0.55 mg/dL (ref 0.44–1.00)
GFR calc non Af Amer: >60 mL/min (ref >60)
Glucose, Bld: 151 mg/dL — ABNORMAL HIGH (ref 70–99)
POTASSIUM: 4 mmol/L (ref 3.5–5.1)
SODIUM: 140 mmol/L (ref 135–145)

## 2018-02-08 LAB — HIV ANTIBODY (ROUTINE TESTING W REFLEX): HIV SCREEN 4TH GENERATION: NONREACTIVE

## 2018-02-08 LAB — CBC
HCT: 39.6 % (ref 36.0–46.0)
Hemoglobin: 13.2 g/dL (ref 12.0–15.0)
MCH: 30.8 pg (ref 26.0–34.0)
MCHC: 33.3 g/dL (ref 30.0–36.0)
MCV: 92.5 fL (ref 78.0–100.0)
Platelets: 252 10*3/uL (ref 150–400)
RBC: 4.28 MIL/uL (ref 3.87–5.11)
RDW: 13 % (ref 11.5–15.5)
WBC: 9.1 10*3/uL (ref 4.0–10.5)

## 2018-02-08 MED ORDER — HYDROCODONE-ACETAMINOPHEN 5-325 MG PO TABS: 1.0000 | ORAL_TABLET | ORAL | Status: DC | PRN

## 2018-02-08 MED ORDER — SODIUM CHLORIDE 0.9 % IV SOLN: INTRAVENOUS | Status: DC

## 2018-02-08 NOTE — Discharge Summary (Signed)
Physician Discharge Summary  Dana [name] ZOX:[id number] DOB: 22-Jul-1995 DOA: 02/07/2018  PCP: System, Provider Not In  Admit date: 02/07/2018 Discharge date: 02/08/2018  Admitted From: Home Disposition:  Home  Recommendations for Outpatient Follow-up:  1. Follow up with Dr. Lazarus SalinesWolicki in 2 weeks  Discharge Condition: Stable CODE STATUS: Full  Diet recommendation: Regular diet   Brief/Interim Summary: Dana [name] is a 22 y.o. female with medical history significant of history of mononucleosis; who presents with complaints of right jaw swelling and pain which she first noted yesterday morning.  Initially swelling was minimal.  She describes the pain as an achy with radiation down to the upper part of her neck.  Associated symptoms include some mild discomfort with eating. Denies having any fever, chills, abdominal pain, vomiting, or inability to tolerate secretions.  Patient was seen in urgent care yesterday and given Aleve.  However she woke up this morning with worsening swelling and pain of her jaw.  She went back to urgent care today, and was given IM Rocephin and sent to the emergency room for CT scan of her neck.   Upon admission to the emergency department patient was noted to be afebrile with vitals within normal limits.  Labs revealed WBC 10.3 with increased neutrophil count, but otherwise were within normal limits.  CT scan of the neck and soft tissues with contrast showed enlarged right parotid gland with surrounding inflammation suggestive of acute parotiditis.  Dr. Lazarus SalinesWolicki of ENT was consulted and recommended treated patient with Unasyn, 10 mg of Decadron, 1 L of normal saline IV fluids, and 15 mg of ketorolac IV.  Patient doing much better this morning. Swelling has improved, tolerating PO well. Will discharge home on augmentin and she will follow up with Dr. Lazarus SalinesWolicki in office in 2 weeks.   Discharge Diagnoses:  Principal Problem:   Parotiditis   Discharge Instructions  Discharge  Instructions    Call MD for:  difficulty breathing, headache or visual disturbances   Complete by:  As directed    Call MD for:  extreme fatigue   Complete by:  As directed    Call MD for:  hives   Complete by:  As directed    Call MD for:  persistant dizziness or light-headedness   Complete by:  As directed    Call MD for:  persistant nausea and vomiting   Complete by:  As directed    Call MD for:  severe uncontrolled pain   Complete by:  As directed    Call MD for:  temperature >100.4   Complete by:  As directed    Diet general   Complete by:  As directed    Discharge instructions   Complete by:  As directed    You were cared for by a hospitalist during your hospital stay. If you have any questions about your discharge medications or the care you received while you were in the hospital after you are discharged, you can call the unit and ask to speak with the hospitalist on call if the hospitalist that took care of you is not available. Once you are discharged, your primary care physician will handle any further medical issues. Please note that NO REFILLS for any discharge medications will be authorized once you are discharged, as it is imperative that you return to your primary care physician (or establish a relationship with a primary care physician if you do not have one) for your aftercare needs so that they can reassess your need  for medications and monitor your lab values.   Increase activity slowly   Complete by:  As directed      Allergies as of 02/08/2018   No Known Allergies     Medication List    TAKE these medications   amoxicillin-clavulanate 875-125 MG tablet Commonly known as:  AUGMENTIN Take 1 tablet by mouth every 12 (twelve) hours.   FLUoxetine 40 MG capsule Commonly known as:  PROZAC Take 40 mg by mouth daily.   ibuprofen 200 MG tablet Commonly known as:  ADVIL,MOTRIN Take 400 mg by mouth daily as needed (pain).   naproxen sodium 220 MG tablet Commonly  known as:  ALEVE Take 440 mg by mouth daily as needed (pain).   norethindrone-ethinyl estradiol 1-20 MG-MCG tablet Commonly known as:  MICROGESTIN,JUNEL,LOESTRIN Take 1 tablet by mouth daily.      Follow-up Information    Flo Shanks, MD. Schedule an appointment as soon as possible for a visit in 2 week(s).   Specialty:  Otolaryngology Contact information: [address] [place] [address] [phone]          No Known Allergies  Consultations:  ENT    Procedures/Studies: Ct Soft Tissue Neck W Contrast  Result Date: 02/07/2018 CLINICAL DATA:  Right facial swelling. Swelling and pain have rapidly worsened. EXAM: CT NECK WITH CONTRAST TECHNIQUE: Multidetector CT imaging of the neck was performed using the standard protocol following the bolus administration of intravenous contrast. CONTRAST:  75mL OMNIPAQUE IOHEXOL 300 MG/ML  SOLN COMPARISON:  None. FINDINGS: PHARYNX AND LARYNX: --Nasopharynx: Fossae of Rosenmuller are clear. Normal adenoid tonsils for age. --Oral cavity and oropharynx: The palatine and lingual tonsils are normal. The visible oral cavity and floor of mouth are normal. --Hypopharynx: Normal vallecula and pyriform sinuses. --Larynx: Normal epiglottis and pre-epiglottic space. Normal aryepiglottic and vocal folds. --Retropharyngeal space: No abscess, effusion or lymphadenopathy. SALIVARY GLANDS: --Parotid: The right parotid gland is enlarged with surrounding inflammatory stranding that extends along the lateral right neck to the level of the thyroid cartilage. There is thickening of the overlying platysma muscle with mild inflammatory induration of the adjacent subcutaneous fat. There is no abscess or fluid collection. No sialolithiasis. The left parotid gland is normal. --Submandibular: Symmetric without inflammation. No sialolithiasis or ductal dilatation. --Sublingual: Normal. No ranula or other visible lesion of the base of tongue and floor of mouth.  THYROID: Normal. LYMPH NODES: There are multiple right level 2A reactive lymph nodes measuring up to 9 mm. VASCULAR: Major cervical vessels are patent. LIMITED INTRACRANIAL: Normal. VISUALIZED ORBITS: Normal. MASTOIDS AND VISUALIZED PARANASAL SINUSES: No fluid levels or advanced mucosal thickening. No mastoid effusion. SKELETON: No bony spinal canal stenosis. No lytic or blastic lesions. UPPER CHEST: Clear. OTHER: None. IMPRESSION: 1. Enlarged right parotid gland with surrounding inflammatory change extending inferiorly along the right neck to approximately the level of the thyroid cartilage. This is most suggestive of acute parotiditis. No sialolithiasis or ductal obstruction. 2. Reactive right cervical lymphadenopathy. Electronically Signed   By: Deatra Robinson M.D.   On: 02/07/2018 19:26      Discharge Exam: Vitals:   02/07/18 2215 02/08/18 0642  BP: 125/81 105/64  Pulse: (!) 103 65  Resp: 16 16  Temp: 98.2 F (36.8 C) 98.4 F (36.9 C)  SpO2: 100% 99%    General: Pt is alert, awake, not in acute distress ENT: right lower facial swelling, but improved in comparison to photo provided by patient. Slight TTP  Cardiovascular: RRR, S1/S2 +,  no rubs, no gallops Respiratory: CTA bilaterally, no wheezing, no rhonchi Abdominal: Soft, NT, ND, bowel sounds + Extremities: no edema, no cyanosis    The results of significant diagnostics from this hospitalization (including imaging, microbiology, ancillary and laboratory) are listed below for reference.     Microbiology: No results found for this or any previous visit (from the past 240 hour(s)).   Labs: BNP (last 3 results) No results for input(s): BNP in the last 8760 hours. Basic Metabolic Panel: Recent Labs  Lab 02/07/18 1751 02/08/18 0547  NA 137 140  K 3.7 4.0  CL 103 109  CO2 24 21*  GLUCOSE 85 151*  BUN 9 6  CREATININE 0.67 0.55  CALCIUM 9.1 9.1   Liver Function Tests: No results for input(s): AST, ALT, ALKPHOS, BILITOT,  PROT, ALBUMIN in the last 168 hours. No results for input(s): LIPASE, AMYLASE in the last 168 hours. No results for input(s): AMMONIA in the last 168 hours. CBC: Recent Labs  Lab 02/07/18 1751 02/08/18 0547  WBC 10.3 9.1  NEUTROABS 7.9*  --   HGB 13.3 13.2  HCT 39.9 39.6  MCV 92.8 92.5  PLT 229 252   Cardiac Enzymes: No results for input(s): CKTOTAL, CKMB, CKMBINDEX, TROPONINI in the last 168 hours. BNP: Invalid input(s): POCBNP CBG: No results for input(s): GLUCAP in the last 168 hours. D-Dimer No results for input(s): DDIMER in the last 72 hours. Hgb A1c No results for input(s): HGBA1C in the last 72 hours. Lipid Profile No results for input(s): CHOL, HDL, LDLCALC, TRIG, CHOLHDL, LDLDIRECT in the last 72 hours. Thyroid function studies No results for input(s): TSH, T4TOTAL, T3FREE, THYROIDAB in the last 72 hours.  Invalid input(s): FREET3 Anemia work up No results for input(s): VITAMINB12, FOLATE, FERRITIN, TIBC, IRON, RETICCTPCT in the last 72 hours. Urinalysis No results found for: COLORURINE, APPEARANCEUR, LABSPEC, PHURINE, GLUCOSEU, HGBUR, BILIRUBINUR, KETONESUR, PROTEINUR, UROBILINOGEN, NITRITE, LEUKOCYTESUR Sepsis Labs Invalid input(s): PROCALCITONIN,  WBC,  LACTICIDVEN Microbiology No results found for this or any previous visit (from the past 240 hour(s)).   Patient was seen and examined on the day of discharge and was found to be in stable condition. Time coordinating discharge: 25 minutes including assessment and coordination of care, as well as examination of the patient.   SIGNED:  Noralee Stain, DO [facility] Pager [phone]  If 7PM-7AM, please contact night-coverage [email or web address] Password [email or web address] 02/08/2018, 10:40 AM

## 2018-02-08 NOTE — Progress Notes (Signed)
02/08/2018 2:15 PM  Dana [name], Dana [name] [id number]  Hosp Day 2    Temp:  [98.2 F (36.8 C)-98.5 F (36.9 C)] 98.4 F (36.9 C) (08/03 1324) Pulse Rate:  [65-103] 65 (08/03 0642) Resp:  [14-18] 16 (08/03 0642) BP: (105-128)/(64-93) 105/64 (08/03 0642) SpO2:  [97 %-100 %] 99 % (08/03 0642) Weight:  [65.8 kg (145 lb)] 65.8 kg (145 lb) (08/02 1639),     Intake/Output Summary (Last 24 hours) at 02/08/2018 1415 Last data filed at 02/08/2018 1239 Gross per 24 hour  Intake 1136.59 ml  Output -  Net 1136.59 ml    Results for orders placed or performed during the hospital encounter of 02/07/18 (from the past 24 hour(s))  CBC with Differential     Status: Abnormal   Collection Time: 02/07/18  5:51 PM  Result Value Ref Range   WBC 10.3 4.0 - 10.5 K/uL   RBC 4.30 3.87 - 5.11 MIL/uL   Hemoglobin 13.3 12.0 - 15.0 g/dL   HCT 40.1 02.7 - 25.3 %   MCV 92.8 78.0 - 100.0 fL   MCH 30.9 26.0 - 34.0 pg   MCHC 33.3 30.0 - 36.0 g/dL   RDW 66.4 40.3 - 47.4 %   Platelets 229 150 - 400 K/uL   Neutrophils Relative % 77 %   Neutro Abs 7.9 (H) 1.7 - 7.7 K/uL   Lymphocytes Relative 14 %   Lymphs Abs 1.5 0.7 - 4.0 K/uL   Monocytes Relative 9 %   Monocytes Absolute 0.9 0.1 - 1.0 K/uL   Eosinophils Relative 0 %   Eosinophils Absolute 0.0 0.0 - 0.7 K/uL   Basophils Relative 0 %   Basophils Absolute 0.0 0.0 - 0.1 K/uL  Basic metabolic panel     Status: None   Collection Time: 02/07/18  5:51 PM  Result Value Ref Range   Sodium 137 135 - 145 mmol/L   Potassium 3.7 3.5 - 5.1 mmol/L   Chloride 103 98 - 111 mmol/L   CO2 24 22 - 32 mmol/L   Glucose, Bld 85 70 - 99 mg/dL   BUN 9 6 - 20 mg/dL   Creatinine, Ser 2.59 0.44 - 1.00 mg/dL   Calcium 9.1 8.9 - 56.3 mg/dL   GFR calc non Af Amer >60 >60 mL/min   GFR calc Af Amer >60 >60 mL/min   Anion gap 10 5 - 15  I-Stat beta hCG blood, ED     Status: None   Collection Time: 02/07/18  6:03 PM  Result Value Ref Range   I-stat hCG, quantitative <5.0 <5 mIU/mL   Comment 3          CBC     Status: None   Collection Time: 02/08/18  5:47 AM  Result Value Ref Range   WBC 9.1 4.0 - 10.5 K/uL   RBC 4.28 3.87 - 5.11 MIL/uL   Hemoglobin 13.2 12.0 - 15.0 g/dL   HCT 87.5 64.3 - 32.9 %   MCV 92.5 78.0 - 100.0 fL   MCH 30.8 26.0 - 34.0 pg   MCHC 33.3 30.0 - 36.0 g/dL   RDW 51.8 84.1 - 66.0 %   Platelets 252 150 - 400 K/uL  Basic metabolic panel     Status: Abnormal   Collection Time: 02/08/18  5:47 AM  Result Value Ref Range   Sodium 140 135 - 145 mmol/L   Potassium 4.0 3.5 - 5.1 mmol/L   Chloride 109 98 - 111 mmol/L   CO2 21 (L) 22 -  32 mmol/L   Glucose, Bld 151 (H) 70 - 99 mg/dL   BUN 6 6 - 20 mg/dL   Creatinine, Ser 1.610.55 0.44 - 1.00 mg/dL   Calcium 9.1 8.9 - 09.610.3 mg/dL   GFR calc non Af Amer >60 >60 mL/min   GFR calc Af Amer >60 >60 mL/min   Anion gap 10 5 - 15    SUBJECTIVE:  Less swelling and pain  OBJECTIVE:    Less trismus.  Definitely less swelling.  IMPRESSION:   Satisfactory check  PLAN:   OK with discharge home.  Recheck my office 2 weeks.  Flo ShanksWOLICKI, Prithvi Kooi

## 2018-02-08 NOTE — Discharge Instructions (Signed)
Parotitis Parotitis means that you have irritation and swelling (inflammation) in one or both of your parotid glands. These glands make spit (saliva). They are found on each side of your face, below and in front of your earlobes. You may or may not have pain with this condition. Follow these instructions at home: Medicines  Take over-the-counter and prescription medicines only as told by your doctor.  If you were prescribed an antibiotic medicine, take it as told by your doctor. Do not stop taking the antibiotic even if you start to feel better. Managing pain and swelling  Apply warm cloths (compresses) to the swollen area as told by your doctor.  Gently rub your parotid glands as told by your doctor. General instructions   Drink enough fluid to keep your pee (urine) clear or pale yellow.  Suck on sour candy. This may help: ? To make your mouth less dry. ? To make more spit.  Keep your mouth clean and moist. ? Gargle with a salt-water mixture 3-4 times per day, or as needed. ? To make a salt-water mixture, stir -1 tsp of salt into 1 cup of warm water.  Take good care of your mouth: ? Brush your teeth at least two times per day. ? Floss your teeth every day. ? See your dentist regularly.  Do not use tobacco products. These include cigarettes, chewing tobacco, or e-cigarettes. If you need help quitting,  ask your doctor.  Keep all follow-up visits as told by your doctor. This is important. Contact a doctor if:  You have a fever or chills.  You have new symptoms.  Your symptoms get worse.  Your symptoms do not get better with treatment. This information is not intended to replace advice given to you by your health care provider. Make sure you discuss any questions you have with your health care provider. Document Released: 07/28/2010 Document Revised: 12/01/2015 Document Reviewed: 11/18/2014 Elsevier Interactive Patient Education  2018 [employer]  

## 2018-02-08 NOTE — Plan of Care (Signed)
Patient discharged home. IV removed. Discharge instructions given to patient and mother, both verbalized understanding.

## 2019-09-26 IMAGING — CT CT NECK W/ CM
3 of 4 series · 12 of 33 positions shown, 14 images · IV contrast (omnipaque)
Comparison: None.

CLINICAL DATA: Right facial swelling. Swelling and pain have
rapidly worsened.

EXAM:
CT NECK WITH CONTRAST
TECHNIQUE: Multidetector CT imaging of the neck was performed using the
standard protocol following the bolus administration of intravenous
contrast.
CONTRAST:  75mL OMNIPAQUE IOHEXOL 300 MG/ML  SOLN

[Series 3: cor neck · coronal · 0.34mm/px · 3 of 101 slices shown]
[im 32/101  bone]
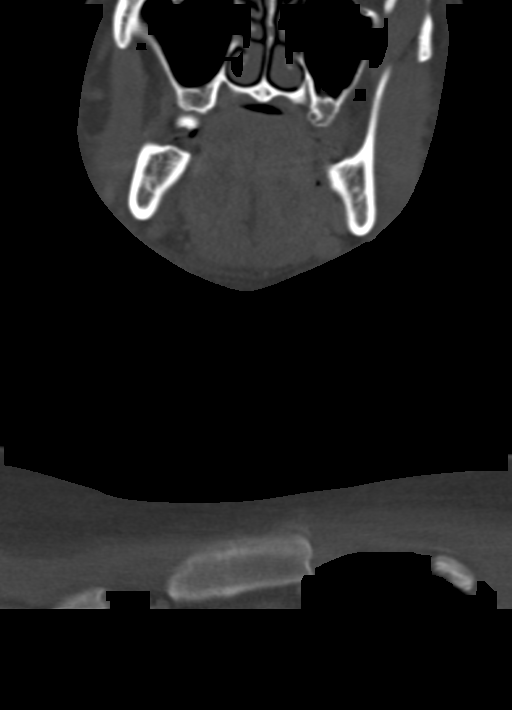
[im 44/101  bone]
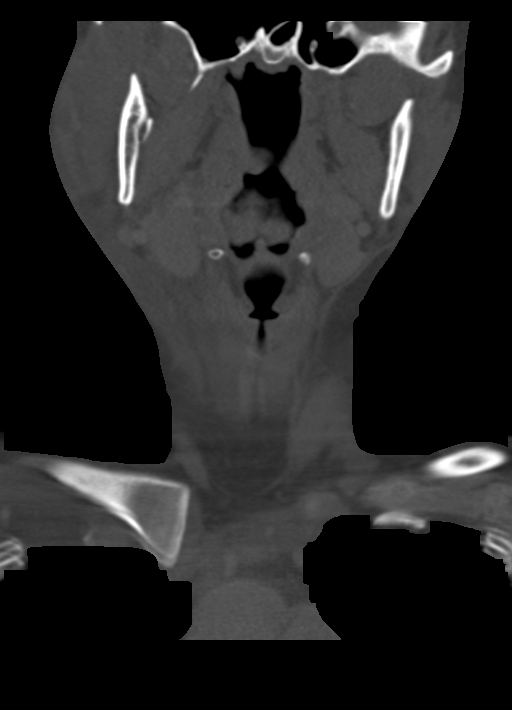
[im 57/101  bone]
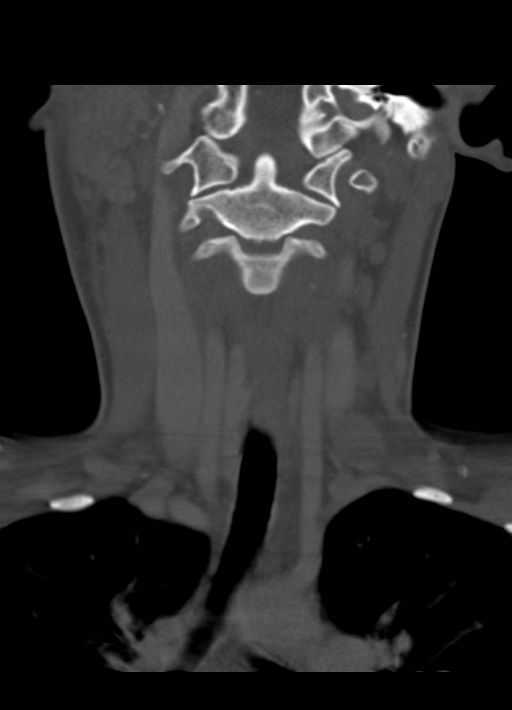

[Series 4: orthogonal ax · axial · 0.32mm/px · z∈[-259,-108]mm · 4 of 115 slices shown, 5 images]
[im 17/115  soft-tissue]
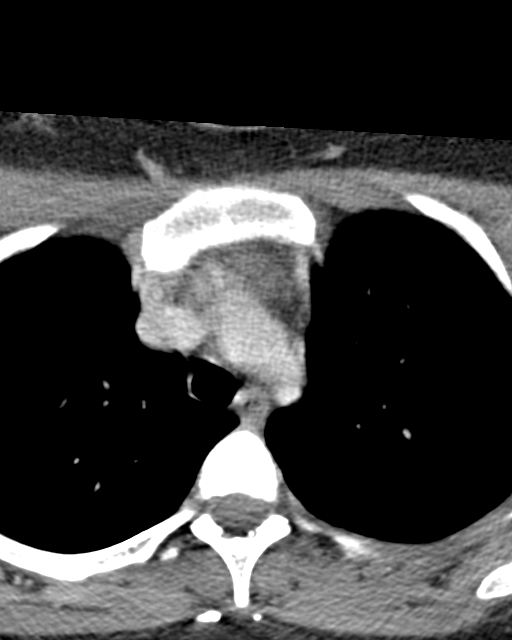
[im 17/115  bone]
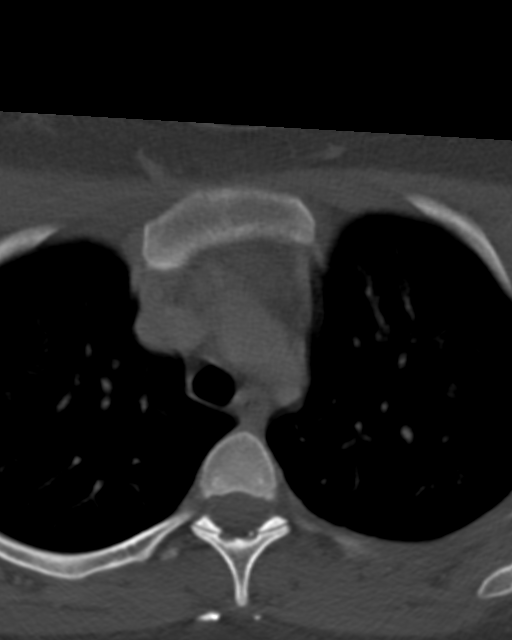
[im 49/115  bone]
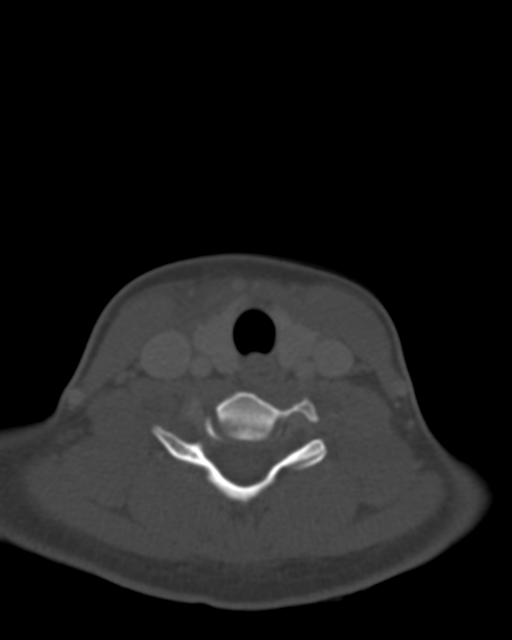
[im 66/115  bone]
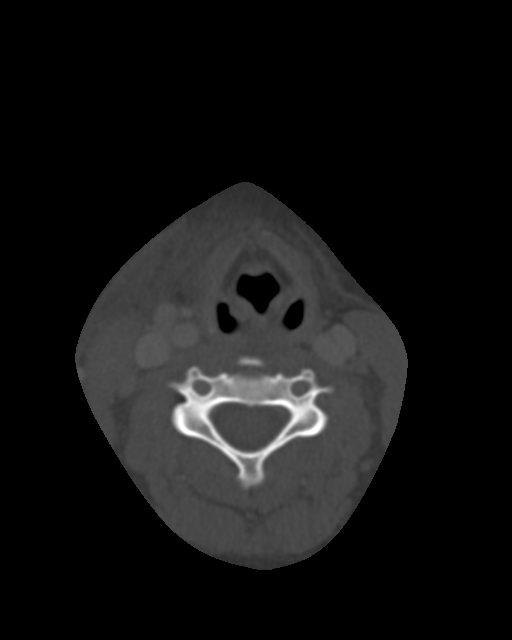
[im 98/115  bone]
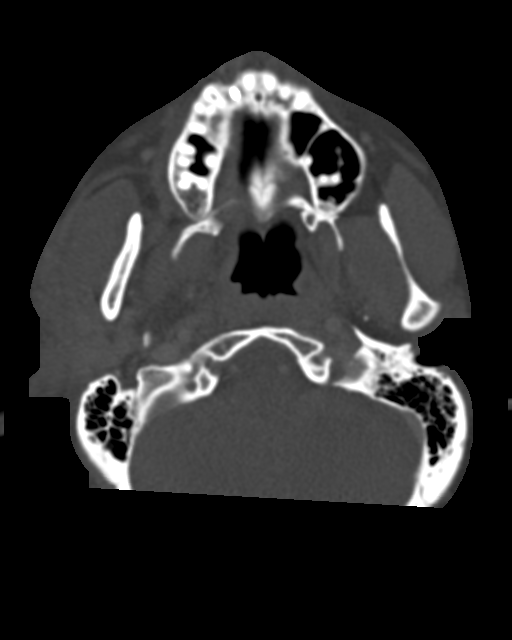

[Series 5: sag neck · sagittal · 0.39mm/px · 5 of 83 slices shown, 6 images]
[im 28/83  bone]
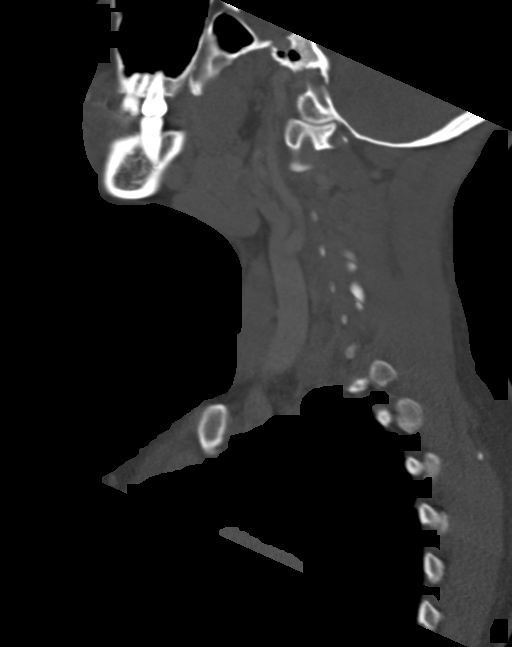
[im 35/83  bone]
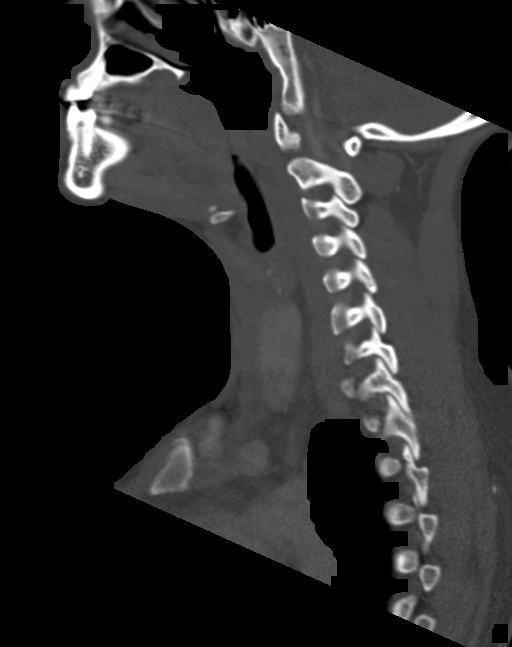
[im 42/83  soft-tissue]
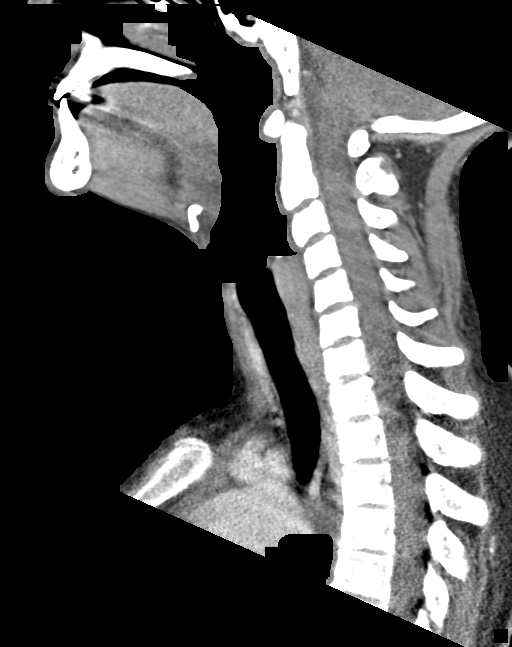
[im 42/83  bone]
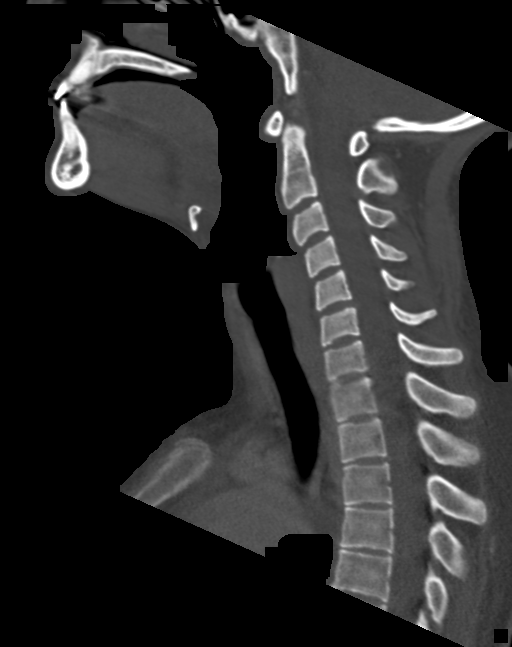
[im 48/83  bone]
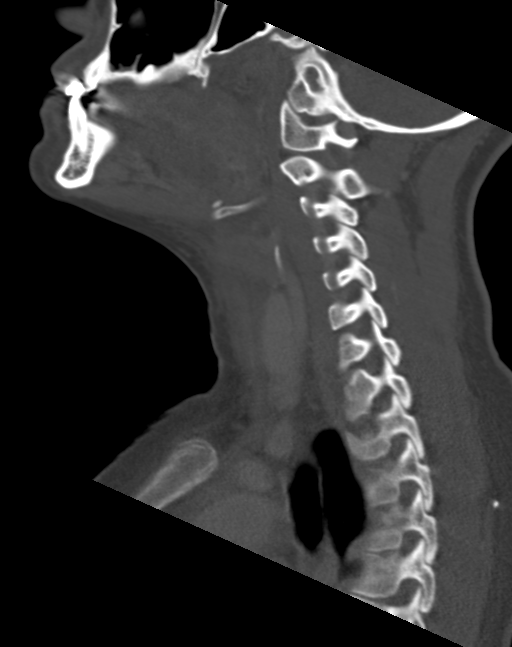
[im 55/83  bone]
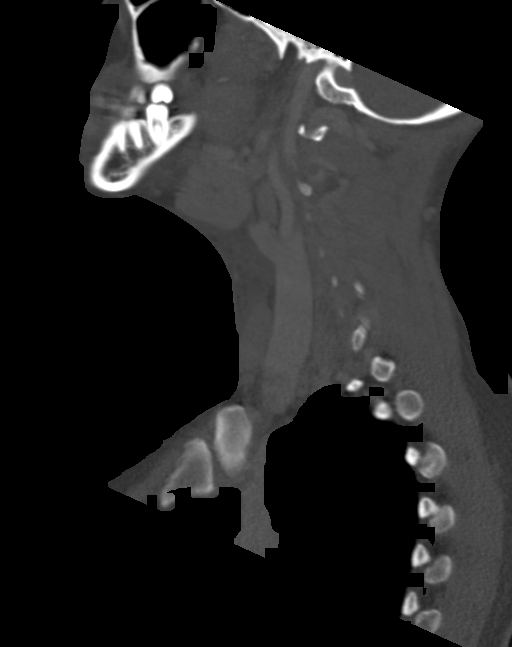

[12 of 33 positions shown; findings below may reference images not displayed]

FINDINGS: PHARYNX AND LARYNX:

--Nasopharynx: Fossae of Rosa are clear. Normal adenoid
tonsils for age.

--Oral cavity and oropharynx: The palatine and lingual tonsils are
normal. The visible oral cavity and floor of mouth are normal.

--Hypopharynx: Normal vallecula and pyriform sinuses.

--Larynx: Normal epiglottis and pre-epiglottic space. Normal
aryepiglottic and vocal folds.

--Retropharyngeal space: No abscess, effusion or lymphadenopathy.

SALIVARY GLANDS:

--Parotid: The right parotid gland is enlarged with surrounding
inflammatory stranding that extends along the lateral right neck to
the level of the thyroid cartilage. There is thickening of the
overlying platysma muscle with mild inflammatory induration of the
adjacent subcutaneous fat. There is no abscess or fluid collection.
No sialolithiasis. The left parotid gland is normal.

--Submandibular: Symmetric without inflammation. No sialolithiasis
or ductal dilatation.

--Sublingual: Normal. No ranula or other visible lesion of the base
of tongue and floor of mouth.

THYROID: Normal.

LYMPH NODES: There are multiple right level 2A reactive lymph nodes
measuring up to 9 mm.

VASCULAR: Major cervical vessels are patent.

LIMITED INTRACRANIAL: Normal.

VISUALIZED ORBITS: Normal.

MASTOIDS AND VISUALIZED PARANASAL SINUSES: No fluid levels or
advanced mucosal thickening. No mastoid effusion.

SKELETON: No bony spinal canal stenosis. No lytic or blastic
lesions.

UPPER CHEST: Clear.

OTHER: None.
IMPRESSION: 1. Enlarged right parotid gland with surrounding inflammatory change
extending inferiorly along the right neck to approximately the level
of the thyroid cartilage. This is most suggestive of acute
parotiditis. No sialolithiasis or ductal obstruction.
2. Reactive right cervical lymphadenopathy.

## 2020-07-06 ENCOUNTER — Other Ambulatory Visit: Payer: Self-pay

## 2020-07-06 DIAGNOSIS — Z20822 Contact with and (suspected) exposure to covid-19: Secondary | ICD-10-CM

## 2020-07-08 LAB — NOVEL CORONAVIRUS, NAA: SARS-CoV-2, NAA: NOT DETECTED

## 2020-07-08 LAB — SARS-COV-2, NAA 2 DAY TAT

## 2022-02-27 DIAGNOSIS — F332 Major depressive disorder, recurrent severe without psychotic features: Secondary | ICD-10-CM | POA: Diagnosis not present

## 2022-04-24 DIAGNOSIS — F332 Major depressive disorder, recurrent severe without psychotic features: Secondary | ICD-10-CM | POA: Diagnosis not present

## 2022-05-08 DIAGNOSIS — H5213 Myopia, bilateral: Secondary | ICD-10-CM | POA: Diagnosis not present

## 2022-05-08 DIAGNOSIS — H52223 Regular astigmatism, bilateral: Secondary | ICD-10-CM | POA: Diagnosis not present

## 2022-05-25 DIAGNOSIS — R202 Paresthesia of skin: Secondary | ICD-10-CM | POA: Diagnosis not present

## 2022-05-25 DIAGNOSIS — R200 Anesthesia of skin: Secondary | ICD-10-CM | POA: Diagnosis not present

## 2022-06-13 DIAGNOSIS — F332 Major depressive disorder, recurrent severe without psychotic features: Secondary | ICD-10-CM | POA: Diagnosis not present

## 2022-06-17 DIAGNOSIS — J029 Acute pharyngitis, unspecified: Secondary | ICD-10-CM | POA: Diagnosis not present

## 2022-06-17 DIAGNOSIS — J069 Acute upper respiratory infection, unspecified: Secondary | ICD-10-CM | POA: Diagnosis not present

## 2022-06-25 DIAGNOSIS — H10021 Other mucopurulent conjunctivitis, right eye: Secondary | ICD-10-CM | POA: Diagnosis not present

## 2022-08-16 DIAGNOSIS — F332 Major depressive disorder, recurrent severe without psychotic features: Secondary | ICD-10-CM | POA: Diagnosis not present

## 2022-10-01 DIAGNOSIS — F332 Major depressive disorder, recurrent severe without psychotic features: Secondary | ICD-10-CM | POA: Diagnosis not present

## 2022-10-08 DIAGNOSIS — Z6825 Body mass index (BMI) 25.0-25.9, adult: Secondary | ICD-10-CM | POA: Diagnosis not present

## 2022-10-08 DIAGNOSIS — Z01419 Encounter for gynecological examination (general) (routine) without abnormal findings: Secondary | ICD-10-CM | POA: Diagnosis not present

## 2022-11-27 DIAGNOSIS — F332 Major depressive disorder, recurrent severe without psychotic features: Secondary | ICD-10-CM | POA: Diagnosis not present

## 2023-01-22 DIAGNOSIS — F332 Major depressive disorder, recurrent severe without psychotic features: Secondary | ICD-10-CM | POA: Diagnosis not present

## 2023-03-19 DIAGNOSIS — F332 Major depressive disorder, recurrent severe without psychotic features: Secondary | ICD-10-CM | POA: Diagnosis not present

## 2023-04-30 DIAGNOSIS — F411 Generalized anxiety disorder: Secondary | ICD-10-CM | POA: Diagnosis not present

## 2023-05-03 DIAGNOSIS — F411 Generalized anxiety disorder: Secondary | ICD-10-CM | POA: Diagnosis not present

## 2023-05-14 DIAGNOSIS — J018 Other acute sinusitis: Secondary | ICD-10-CM | POA: Diagnosis not present

## 2023-05-14 DIAGNOSIS — F332 Major depressive disorder, recurrent severe without psychotic features: Secondary | ICD-10-CM | POA: Diagnosis not present

## 2023-05-14 DIAGNOSIS — U071 COVID-19: Secondary | ICD-10-CM | POA: Diagnosis not present

## 2023-05-21 DIAGNOSIS — F411 Generalized anxiety disorder: Secondary | ICD-10-CM | POA: Diagnosis not present

## 2023-05-21 DIAGNOSIS — F900 Attention-deficit hyperactivity disorder, predominantly inattentive type: Secondary | ICD-10-CM | POA: Diagnosis not present

## 2023-06-18 DIAGNOSIS — F411 Generalized anxiety disorder: Secondary | ICD-10-CM | POA: Diagnosis not present

## 2023-06-18 DIAGNOSIS — F900 Attention-deficit hyperactivity disorder, predominantly inattentive type: Secondary | ICD-10-CM | POA: Diagnosis not present

## 2023-06-24 DIAGNOSIS — F332 Major depressive disorder, recurrent severe without psychotic features: Secondary | ICD-10-CM | POA: Diagnosis not present

## 2023-07-19 DIAGNOSIS — F900 Attention-deficit hyperactivity disorder, predominantly inattentive type: Secondary | ICD-10-CM | POA: Diagnosis not present

## 2023-07-19 DIAGNOSIS — F411 Generalized anxiety disorder: Secondary | ICD-10-CM | POA: Diagnosis not present

## 2023-08-09 DIAGNOSIS — F411 Generalized anxiety disorder: Secondary | ICD-10-CM | POA: Diagnosis not present

## 2023-08-20 DIAGNOSIS — F332 Major depressive disorder, recurrent severe without psychotic features: Secondary | ICD-10-CM | POA: Diagnosis not present

## 2023-08-23 DIAGNOSIS — F411 Generalized anxiety disorder: Secondary | ICD-10-CM | POA: Diagnosis not present

## 2023-09-06 DIAGNOSIS — F411 Generalized anxiety disorder: Secondary | ICD-10-CM | POA: Diagnosis not present

## 2023-10-04 DIAGNOSIS — F411 Generalized anxiety disorder: Secondary | ICD-10-CM | POA: Diagnosis not present

## 2023-10-15 DIAGNOSIS — F332 Major depressive disorder, recurrent severe without psychotic features: Secondary | ICD-10-CM | POA: Diagnosis not present

## 2023-10-22 DIAGNOSIS — Z01419 Encounter for gynecological examination (general) (routine) without abnormal findings: Secondary | ICD-10-CM | POA: Diagnosis not present

## 2023-11-05 DIAGNOSIS — F411 Generalized anxiety disorder: Secondary | ICD-10-CM | POA: Diagnosis not present

## 2023-12-11 DIAGNOSIS — F332 Major depressive disorder, recurrent severe without psychotic features: Secondary | ICD-10-CM | POA: Diagnosis not present

## 2023-12-19 DIAGNOSIS — Z0000 Encounter for general adult medical examination without abnormal findings: Secondary | ICD-10-CM | POA: Diagnosis not present

## 2023-12-19 DIAGNOSIS — Z13220 Encounter for screening for lipoid disorders: Secondary | ICD-10-CM | POA: Diagnosis not present

## 2024-02-03 DIAGNOSIS — F332 Major depressive disorder, recurrent severe without psychotic features: Secondary | ICD-10-CM | POA: Diagnosis not present
